# Patient Record
Sex: Female | Born: 1965 | Race: Asian | Hispanic: No | Marital: Married | Smoking: Never smoker
Health system: Southern US, Community
[De-identification: ages and names within clinical notes are randomized; demographics above are authoritative.]

## PROBLEM LIST (undated history)

## (undated) DIAGNOSIS — T7840XA Allergy, unspecified, initial encounter: Secondary | ICD-10-CM

## (undated) DIAGNOSIS — R42 Dizziness and giddiness: Secondary | ICD-10-CM

## (undated) HISTORY — DX: Allergy, unspecified, initial encounter: T78.40XA

## (undated) HISTORY — DX: Dizziness and giddiness: R42

## (undated) HISTORY — PX: WISDOM TOOTH EXTRACTION: SHX21

---

## 2003-06-15 ENCOUNTER — Encounter: Admission: RE | Admit: 2003-06-15 | Discharge: 2003-06-29 | Payer: Self-pay | Admitting: Neurology

## 2006-02-27 ENCOUNTER — Encounter: Admission: RE | Admit: 2006-02-27 | Discharge: 2006-02-27 | Payer: Self-pay | Admitting: Family Medicine

## 2006-08-29 ENCOUNTER — Encounter: Admission: RE | Admit: 2006-08-29 | Discharge: 2006-08-29 | Payer: Self-pay | Admitting: Internal Medicine

## 2007-01-31 ENCOUNTER — Other Ambulatory Visit: Admission: RE | Admit: 2007-01-31 | Discharge: 2007-01-31 | Payer: Self-pay | Admitting: Gynecology

## 2007-03-08 ENCOUNTER — Encounter: Admission: RE | Admit: 2007-03-08 | Discharge: 2007-03-08 | Payer: Self-pay | Admitting: Internal Medicine

## 2007-03-19 ENCOUNTER — Ambulatory Visit (HOSPITAL_COMMUNITY): Admission: RE | Admit: 2007-03-19 | Discharge: 2007-03-19 | Payer: Self-pay | Admitting: Gynecology

## 2008-11-10 ENCOUNTER — Encounter: Admission: RE | Admit: 2008-11-10 | Discharge: 2008-11-10 | Payer: Self-pay | Admitting: Internal Medicine

## 2010-07-17 ENCOUNTER — Encounter: Payer: Self-pay | Admitting: Family Medicine

## 2010-08-05 ENCOUNTER — Other Ambulatory Visit: Payer: Self-pay | Admitting: Internal Medicine

## 2010-08-05 DIAGNOSIS — Z1231 Encounter for screening mammogram for malignant neoplasm of breast: Secondary | ICD-10-CM

## 2010-08-15 ENCOUNTER — Ambulatory Visit
Admission: RE | Admit: 2010-08-15 | Discharge: 2010-08-15 | Disposition: A | Discharge status: Home / Self Care | Payer: Commercial Indemnity | Source: Ambulatory Visit | Attending: Internal Medicine | Admitting: Internal Medicine

## 2010-08-15 DIAGNOSIS — Z1231 Encounter for screening mammogram for malignant neoplasm of breast: Secondary | ICD-10-CM

## 2013-05-12 ENCOUNTER — Encounter: Payer: Self-pay | Admitting: Obstetrics & Gynecology

## 2013-07-02 ENCOUNTER — Ambulatory Visit: Payer: Self-pay | Admitting: Obstetrics & Gynecology

## 2013-07-09 ENCOUNTER — Ambulatory Visit: Payer: Commercial Indemnity | Admitting: Obstetrics & Gynecology

## 2013-07-16 ENCOUNTER — Encounter: Payer: Self-pay | Admitting: Obstetrics & Gynecology

## 2013-07-16 ENCOUNTER — Ambulatory Visit (INDEPENDENT_AMBULATORY_CARE_PROVIDER_SITE_OTHER): Admitting: Obstetrics & Gynecology

## 2013-07-16 VITALS — BP 101/67 | HR 77 | Temp 97.9°F | Ht 60.0 in | Wt 111.0 lb

## 2013-07-16 DIAGNOSIS — IMO0001 Reserved for inherently not codable concepts without codable children: Secondary | ICD-10-CM

## 2013-07-16 DIAGNOSIS — Z01419 Encounter for gynecological examination (general) (routine) without abnormal findings: Secondary | ICD-10-CM

## 2013-07-16 DIAGNOSIS — N939 Abnormal uterine and vaginal bleeding, unspecified: Secondary | ICD-10-CM

## 2013-07-16 DIAGNOSIS — N926 Irregular menstruation, unspecified: Secondary | ICD-10-CM

## 2013-07-16 LAB — POCT URINALYSIS DIPSTICK
BILIRUBIN UA: NEGATIVE
Blood, UA: NEGATIVE
GLUCOSE UA: NEGATIVE
Ketones, UA: NEGATIVE
LEUKOCYTES UA: NEGATIVE
NITRITE UA: NEGATIVE
Protein, UA: NEGATIVE
Spec Grav, UA: 1.015
UROBILINOGEN UA: NEGATIVE
pH, UA: 7

## 2013-07-16 LAB — POCT URINE PREGNANCY: Preg Test, Ur: NEGATIVE

## 2013-07-16 NOTE — Progress Notes (Signed)
Subjective:     Miranda Holloway is a 48 y.o. female here for a routine exam.  Current complaints: irregular cycles.  Personal health questionnaire reviewed: yes.   Gynecologic History Patient's last menstrual period was 07/07/2013. Contraception: none Last Pap: unsure denies hx of abnormal result Last mammogram:2014 . Results were: normal  The following portions of the patient's history were reviewed and updated as appropriate: allergies, current medications, past family history, past medical history, past social history, past surgical history and problem list.  Review of Systems Pertinent items are noted in HPI.   Objective:     General appearance: alert Breasts: normal appearance, no masses or tenderness Abdomen: soft, non-tender; bowel sounds normal; no masses,  no organomegaly Pelvic: cervix normal in appearance, external genitalia normal, no adnexal masses or tenderness, uterus normal size, shape, and consistency and vagina normal without discharge     Assessment:  Irregular periods- bleeding in between cycles x 2 episodes, no pain, no history of fibroids, denies thyroid problems, denies breast drainage nabothian cyst on cervix   Plan:   Pelvic ultrasound  TSH Prolactin Return after the U/S

## 2013-07-17 LAB — TSH: TSH: 1.616 u[IU]/mL (ref 0.350–4.500)

## 2013-07-17 LAB — PROLACTIN: Prolactin: 9.4 ng/mL

## 2013-07-17 LAB — HIV ANTIBODY (ROUTINE TESTING W REFLEX): HIV: NONREACTIVE

## 2013-07-17 LAB — RPR

## 2013-07-18 DIAGNOSIS — N926 Irregular menstruation, unspecified: Secondary | ICD-10-CM | POA: Insufficient documentation

## 2013-07-18 LAB — PAP IG, CT-NG, RFX HPV ASCU
CHLAMYDIA PROBE AMP: NEGATIVE
GC PROBE AMP: NEGATIVE

## 2013-07-18 NOTE — Patient Instructions (Signed)
Health Maintenance, Female A healthy lifestyle and preventative care can promote health and wellness.  Maintain regular health, dental, and eye exams.  Eat a healthy diet. Foods like vegetables, fruits, whole grains, low-fat dairy products, and lean protein foods contain the nutrients you need without too many calories. Decrease your intake of foods high in solid fats, added sugars, and salt. Get information about a proper diet from your caregiver, if necessary.  Regular physical exercise is one of the most important things you can do for your health. Most adults should get at least 150 minutes of moderate-intensity exercise (any activity that increases your heart rate and causes you to sweat) each week. In addition, most adults need muscle-strengthening exercises on 2 or more days a week.   Maintain a healthy weight. The body mass index (BMI) is a screening tool to identify possible weight problems. It provides an estimate of body fat based on height and weight. Your caregiver can help determine your BMI, and can help you achieve or maintain a healthy weight. For adults 20 years and older:  A BMI below 18.5 is considered underweight.  A BMI of 18.5 to 24.9 is normal.  A BMI of 25 to 29.9 is considered overweight.  A BMI of 30 and above is considered obese.  Maintain normal blood lipids and cholesterol by exercising and minimizing your intake of saturated fat. Eat a balanced diet with plenty of fruits and vegetables. Blood tests for lipids and cholesterol should begin at age 20 and be repeated every 5 years. If your lipid or cholesterol levels are high, you are over 50, or you are a high risk for heart disease, you may need your cholesterol levels checked more frequently.Ongoing high lipid and cholesterol levels should be treated with medicines if diet and exercise are not effective.  If you smoke, find out from your caregiver how to quit. If you do not use tobacco, do not start.  Lung  cancer screening is recommended for adults aged 55 80 years who are at high risk for developing lung cancer because of a history of smoking. Yearly low-dose computed tomography (CT) is recommended for people who have at least a 30-pack-year history of smoking and are a current smoker or have quit within the past 15 years. A pack year of smoking is smoking an average of 1 pack of cigarettes a day for 1 year (for example: 1 pack a day for 30 years or 2 packs a day for 15 years). Yearly screening should continue until the smoker has stopped smoking for at least 15 years. Yearly screening should also be stopped for people who develop a health problem that would prevent them from having lung cancer treatment.  If you are pregnant, do not drink alcohol. If you are breastfeeding, be very cautious about drinking alcohol. If you are not pregnant and choose to drink alcohol, do not exceed 1 drink per day. One drink is considered to be 12 ounces (355 mL) of beer, 5 ounces (148 mL) of wine, or 1.5 ounces (44 mL) of liquor.  Avoid use of street drugs. Do not share needles with anyone. Ask for help if you need support or instructions about stopping the use of drugs.  High blood pressure causes heart disease and increases the risk of stroke. Blood pressure should be checked at least every 1 to 2 years. Ongoing high blood pressure should be treated with medicines, if weight loss and exercise are not effective.  If you are 55 to   46 years old, ask your caregiver if you should take aspirin to prevent strokes.  Diabetes screening involves taking a blood sample to check your fasting blood sugar level. This should be done once every 3 years, after age 45, if you are within normal weight and without risk factors for diabetes. Testing should be considered at a younger age or be carried out more frequently if you are overweight and have at least 1 risk factor for diabetes.  Breast cancer screening is essential preventative care  for women. You should practice "breast self-awareness." This means understanding the normal appearance and feel of your breasts and may include breast self-examination. Any changes detected, no matter how small, should be reported to a caregiver. Women in their 20s and 30s should have a clinical breast exam (CBE) by a caregiver as part of a regular health exam every 1 to 3 years. After age 40, women should have a CBE every year. Starting at age 40, women should consider having a mammogram (breast X-ray) every year. Women who have a family history of breast cancer should talk to their caregiver about genetic screening. Women at a high risk of breast cancer should talk to their caregiver about having an MRI and a mammogram every year.  Breast cancer gene (BRCA)-related cancer risk assessment is recommended for women who have family members with BRCA-related cancers. BRCA-related cancers include breast, ovarian, tubal, and peritoneal cancers. Having family members with these cancers may be associated with an increased risk for harmful changes (mutations) in the breast cancer genes BRCA1 and BRCA2. Results of the assessment will determine the need for genetic counseling and BRCA1 and BRCA2 testing.  The Pap test is a screening test for cervical cancer. Women should have a Pap test starting at age 21. Between ages 21 and 29, Pap tests should be repeated every 2 years. Beginning at age 30, you should have a Pap test every 3 years as long as the past 3 Pap tests have been normal. If you had a hysterectomy for a problem that was not cancer or a condition that could lead to cancer, then you no longer need Pap tests. If you are between ages 65 and 70, and you have had normal Pap tests going back 10 years, you no longer need Pap tests. If you have had past treatment for cervical cancer or a condition that could lead to cancer, you need Pap tests and screening for cancer for at least 20 years after your treatment. If Pap  tests have been discontinued, risk factors (such as a new sexual partner) need to be reassessed to determine if screening should be resumed. Some women have medical problems that increase the chance of getting cervical cancer. In these cases, your caregiver may recommend more frequent screening and Pap tests.  The human papillomavirus (HPV) test is an additional test that may be used for cervical cancer screening. The HPV test looks for the virus that can cause the cell changes on the cervix. The cells collected during the Pap test can be tested for HPV. The HPV test could be used to screen women aged 30 years and older, and should be used in women of any age who have unclear Pap test results. After the age of 30, women should have HPV testing at the same frequency as a Pap test.  Colorectal cancer can be detected and often prevented. Most routine colorectal cancer screening begins at the age of 50 and continues through age 75. However, your caregiver   may recommend screening at an earlier age if you have risk factors for colon cancer. On a yearly basis, your caregiver may provide home test kits to check for hidden blood in the stool. Use of a small camera at the end of a tube, to directly examine the colon (sigmoidoscopy or colonoscopy), can detect the earliest forms of colorectal cancer. Talk to your caregiver about this at age 86, when routine screening begins. Direct examination of the colon should be repeated every 5 to 10 years through age 61, unless early forms of pre-cancerous polyps or small growths are found.  Hepatitis C blood testing is recommended for all people born from [birth year] through 1963 and any individual with known risks for hepatitis C.  Practice safe sex. Use condoms and avoid high-risk sexual practices to reduce the spread of sexually transmitted infections (STIs). Sexually active women aged 64 and younger should be checked for Chlamydia, which is a common sexually transmitted infection.  Older women with new or multiple partners should also be tested for Chlamydia. Testing for other STIs is recommended if you are sexually active and at increased risk.  Osteoporosis is a disease in which the bones lose minerals and strength with aging. This can result in serious bone fractures. The risk of osteoporosis can be identified using a bone density scan. Women ages 61 and over and women at risk for fractures or osteoporosis should discuss screening with their caregivers. Ask your caregiver whether you should be taking a calcium supplement or vitamin D to reduce the rate of osteoporosis.  Menopause can be associated with physical symptoms and risks. Hormone replacement therapy is available to decrease symptoms and risks. You should talk to your caregiver about whether hormone replacement therapy is right for you.  Use sunscreen. Apply sunscreen liberally and repeatedly throughout the day. You should seek shade when your shadow is shorter than you. Protect yourself by wearing long sleeves, pants, a wide-brimmed hat, and sunglasses year round, whenever you are outdoors.  Notify your caregiver of new moles or changes in moles, especially if there is a change in shape or color. Also notify your caregiver if a mole is larger than the size of a pencil eraser.  Stay current with your immunizations. Document Released: 12/26/2010 Document Revised: 10/07/2012 Document Reviewed: 12/26/2010 [facility] Patient Information 2014 [place]. Abnormal Uterine Bleeding Abnormal uterine bleeding can affect women at various stages in life, including teenagers, women in their reproductive years, pregnant women, and women who have reached menopause. Several kinds of uterine bleeding are considered abnormal, including:  Bleeding or spotting between periods.   Bleeding after sexual intercourse.   Bleeding that is heavier or more than normal.   Periods that last longer than usual.  Bleeding after  menopause.  Many cases of abnormal uterine bleeding are minor and simple to treat, while others are more serious. Any type of abnormal bleeding should be evaluated by your health care provider. Treatment will depend on the cause of the bleeding. HOME CARE INSTRUCTIONS Monitor your condition for any changes. The following actions may help to alleviate any discomfort you are experiencing:  Avoid the use of tampons and douches as directed by your health care provider.  Change your pads frequently. You should get regular pelvic exams and Pap tests. Keep all follow-up appointments for diagnostic tests as directed by your health care provider.  SEEK MEDICAL CARE IF:   Your bleeding lasts more than 1 week.   You feel dizzy at times.  SEEK IMMEDIATE  MEDICAL CARE IF:   You pass out.   You are changing pads every 15 to 30 minutes.   You have abdominal pain.  You have a fever.   You become sweaty or weak.   You are passing large blood clots from the vagina.   You start to feel nauseous and vomit. MAKE SURE YOU:   Understand these instructions.  Will watch your condition.  Will get help right away if you are not doing well or get worse. Document Released: 06/12/2005 Document Revised: 02/12/2013 Document Reviewed: 01/09/2013 [facility] Patient Information 2014 [place], [place].

## 2013-07-22 ENCOUNTER — Other Ambulatory Visit: Payer: Self-pay | Admitting: *Deleted

## 2013-07-22 DIAGNOSIS — N926 Irregular menstruation, unspecified: Secondary | ICD-10-CM

## 2013-07-23 ENCOUNTER — Ambulatory Visit (INDEPENDENT_AMBULATORY_CARE_PROVIDER_SITE_OTHER)

## 2013-07-23 ENCOUNTER — Encounter: Payer: Self-pay | Admitting: Obstetrics & Gynecology

## 2013-07-23 ENCOUNTER — Ambulatory Visit (INDEPENDENT_AMBULATORY_CARE_PROVIDER_SITE_OTHER): Admitting: Obstetrics & Gynecology

## 2013-07-23 VITALS — BP 92/63 | HR 87 | Temp 98.0°F | Ht 61.0 in | Wt 111.0 lb

## 2013-07-23 DIAGNOSIS — N926 Irregular menstruation, unspecified: Secondary | ICD-10-CM

## 2013-07-23 DIAGNOSIS — D259 Leiomyoma of uterus, unspecified: Secondary | ICD-10-CM

## 2013-07-23 NOTE — Progress Notes (Signed)
Subjective:     Miranda Holloway is a 48 y.o. female here for a routine exam.  Current complaints: Patient is in office today for follow up of ultrasound results.  Personal health questionnaire reviewed: yes.   Gynecologic History Patient's last menstrual period was 07/07/2013. Contraception: none Last Pap: 06/2013. Results were: normal Last mammogram: 2014.   Obstetric History OB History  Gravida Para Term Preterm AB SAB TAB Ectopic Multiple Living  0 0                 The following portions of the patient's history were reviewed and updated as appropriate: allergies, current medications, past family history, past medical history, past social history, past surgical history and problem list.  Review of Systems Pertinent items are noted in HPI.    Objective:     No exam today     Assessment:    AUB--O, L   Plan:   F/U prn or in 6 mths

## 2013-07-24 ENCOUNTER — Encounter: Payer: Self-pay | Admitting: Obstetrics & Gynecology

## 2013-07-24 DIAGNOSIS — D259 Leiomyoma of uterus, unspecified: Secondary | ICD-10-CM | POA: Insufficient documentation

## 2013-07-24 NOTE — Patient Instructions (Signed)
Fibroids Fibroids are lumps (tumors) that can occur any place in a woman's body. These lumps are not cancerous. Fibroids vary in size, weight, and where they grow. HOME CARE  Do not take aspirin.  Write down the number of pads or tampons you use during your period. Tell your doctor. This can help determine the best treatment for you. GET HELP RIGHT AWAY IF:  You have pain in your lower belly (abdomen) that is not helped with medicine.  You have cramps that are not helped with medicine.  You have more bleeding between or during your period.  You feel lightheaded or pass out (faint).  Your lower belly pain gets worse. MAKE SURE YOU:  Understand these instructions.  Will watch your condition.  Will get help right away if you are not doing well or get worse. Document Released: 07/15/2010 Document Revised: 09/04/2011 Document Reviewed: 07/15/2010 [employer] Patient Information 2014 [employer].  

## 2014-01-21 ENCOUNTER — Ambulatory Visit: Admitting: Obstetrics & Gynecology

## 2014-02-12 ENCOUNTER — Encounter: Payer: Self-pay | Admitting: Obstetrics & Gynecology

## 2014-02-12 ENCOUNTER — Ambulatory Visit (INDEPENDENT_AMBULATORY_CARE_PROVIDER_SITE_OTHER): Admitting: Obstetrics & Gynecology

## 2014-02-12 VITALS — BP 103/70 | HR 71 | Temp 98.3°F | Ht 62.0 in | Wt 118.0 lb

## 2014-02-12 DIAGNOSIS — D259 Leiomyoma of uterus, unspecified: Secondary | ICD-10-CM

## 2014-02-12 NOTE — Patient Instructions (Signed)
Fibroids Fibroids are lumps (tumors) that can occur any place in a woman's body. These lumps are not cancerous. Fibroids vary in size, weight, and where they grow. HOME CARE  Do not take aspirin.  Write down the number of pads or tampons you use during your period. Tell your doctor. This can help determine the best treatment for you. GET HELP RIGHT AWAY IF:  You have pain in your lower belly (abdomen) that is not helped with medicine.  You have cramps that are not helped with medicine.  You have more bleeding between or during your period.  You feel lightheaded or pass out (faint).  Your lower belly pain gets worse. MAKE SURE YOU:  Understand these instructions.  Will watch your condition.  Will get help right away if you are not doing well or get worse. Document Released: 07/15/2010 Document Revised: 09/04/2011 Document Reviewed: 07/15/2010 [employer] Patient Information 2015 [employer]. This information is not intended to replace advice given to you by your health care provider. Make sure you discuss any questions you have with your health care provider.  

## 2014-02-13 NOTE — Progress Notes (Signed)
Subjective:     Miranda Holloway is a 48 y.o. female here for a f/u exam. Denies abnormal uterine bleeding.   Gynecologic History Patient's last menstrual period was 01/18/2014. Contraception: none Last Pap: 06/2013. Results were: normal Last mammogram: 2014.   Obstetric History OB History  Gravida Para Term Preterm AB SAB TAB Ectopic Multiple Living  0 0              BP 103/70  Pulse 71  Temp(Src) 98.3 F (36.8 C)  Ht 5\' 2"  (1.575 m)  Wt 53.524 kg (118 lb)  BMI 21.58 kg/m2  LMP 01/18/2014  General:   alert  Abdomen:  normal findings: no organomegaly, soft, non-tender and no hernia  Pelvis:  External genitalia: normal general appearance Urinary system: urethral meatus normal and bladder without fullness, nontender Vaginal: normal without tenderness, induration or masses Cervix: normal appearance Adnexa: normal bimanual exam Uterus: anteverted and non-tender, normal size    The following portions of the patient's history were reviewed and updated as appropriate: allergies, current medications, past family history, past medical history, past social history, past surgical history and problem list.  Review of Systems Pertinent items are noted in HPI.      Assessment:    Small myomatous uterus; AUB resolved   Plan:   F/U prn or in 1 yr

## 2014-06-22 ENCOUNTER — Encounter: Payer: Self-pay | Admitting: *Deleted

## 2014-06-23 ENCOUNTER — Encounter: Payer: Self-pay | Admitting: Obstetrics & Gynecology

## 2015-03-03 ENCOUNTER — Ambulatory Visit: Admitting: Obstetrics & Gynecology

## 2015-05-28 ENCOUNTER — Encounter: Payer: Commercial Indemnity | Admitting: Internal Medicine

## 2016-04-05 ENCOUNTER — Other Ambulatory Visit: Payer: Self-pay | Admitting: *Deleted

## 2016-04-05 ENCOUNTER — Inpatient Hospital Stay
Admission: RE | Admit: 2016-04-05 | Discharge: 2016-04-05 | Disposition: A | Discharge status: Home / Self Care | Payer: Self-pay | Source: Ambulatory Visit | Attending: *Deleted | Admitting: *Deleted

## 2016-04-05 DIAGNOSIS — Z9289 Personal history of other medical treatment: Secondary | ICD-10-CM

## 2016-04-06 ENCOUNTER — Other Ambulatory Visit: Payer: Self-pay | Admitting: Internal Medicine

## 2016-04-06 DIAGNOSIS — R9230 Dense breasts, unspecified: Secondary | ICD-10-CM

## 2016-04-06 DIAGNOSIS — R922 Inconclusive mammogram: Secondary | ICD-10-CM

## 2016-04-19 ENCOUNTER — Ambulatory Visit
Admission: RE | Admit: 2016-04-19 | Discharge: 2016-04-19 | Disposition: A | Discharge status: Home / Self Care | Source: Ambulatory Visit | Attending: Internal Medicine | Admitting: Internal Medicine

## 2016-04-19 DIAGNOSIS — N6489 Other specified disorders of breast: Secondary | ICD-10-CM | POA: Insufficient documentation

## 2016-04-19 DIAGNOSIS — R922 Inconclusive mammogram: Secondary | ICD-10-CM

## 2017-11-20 IMAGING — MG MM DIGITAL DIAGNOSTIC UNILAT*R* W/ TOMO W/ CAD
6 of 9 series · 6 of 21 positions shown · non-contrast
Comparison: Previous exam(s).

CLINICAL DATA: Screening recall for 2 right breast asymmetries.

EXAM:
2D DIGITAL DIAGNOSTIC UNILATERAL RIGHT MAMMOGRAM WITH CAD AND
ADJUNCT TOMO
RIGHT BREAST ULTRASOUND

[R MLO]
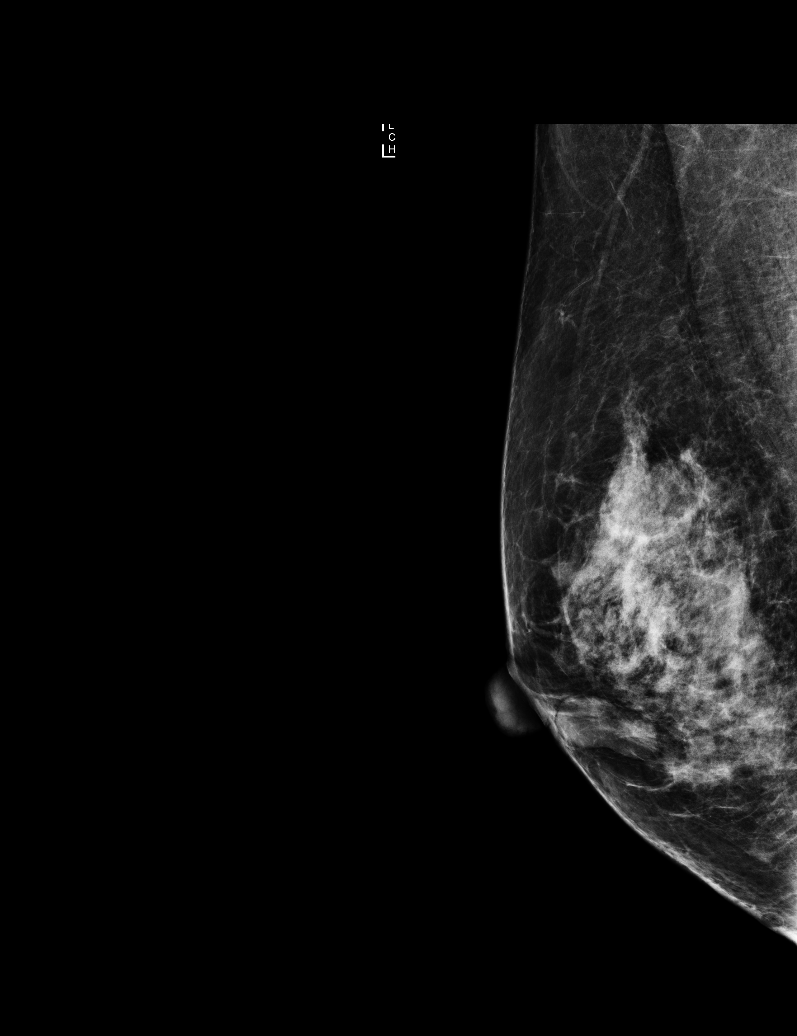

[R CC]
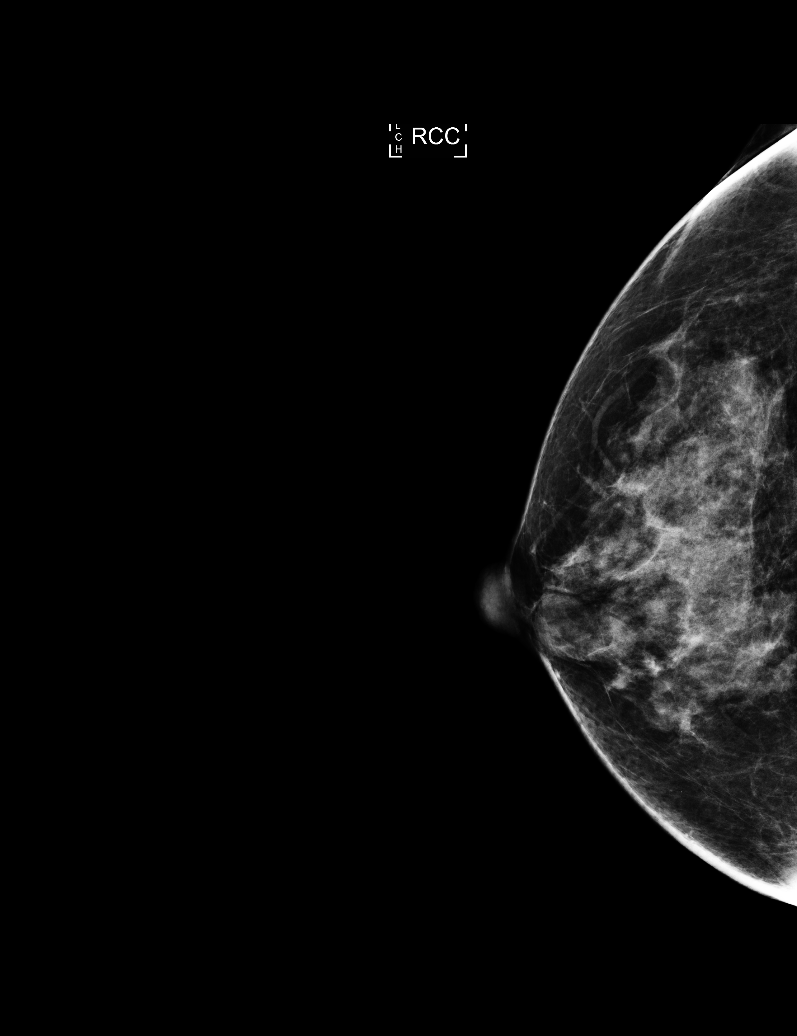

[R CC synth-2D]
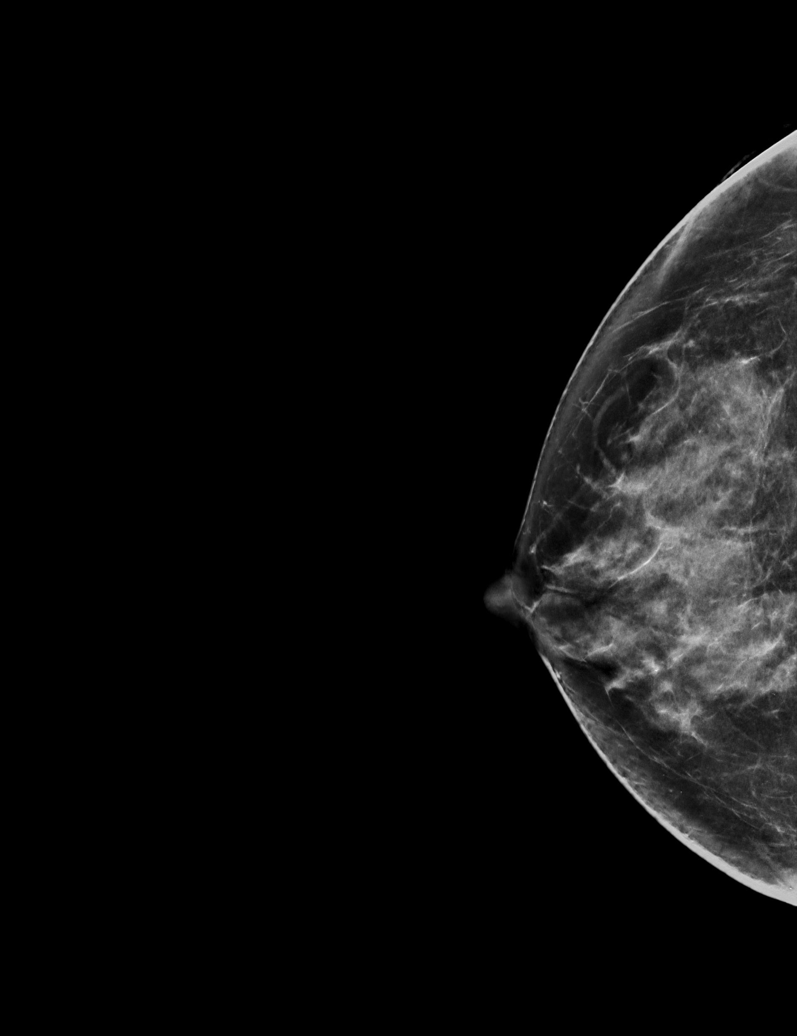

[R MLO synth-2D]
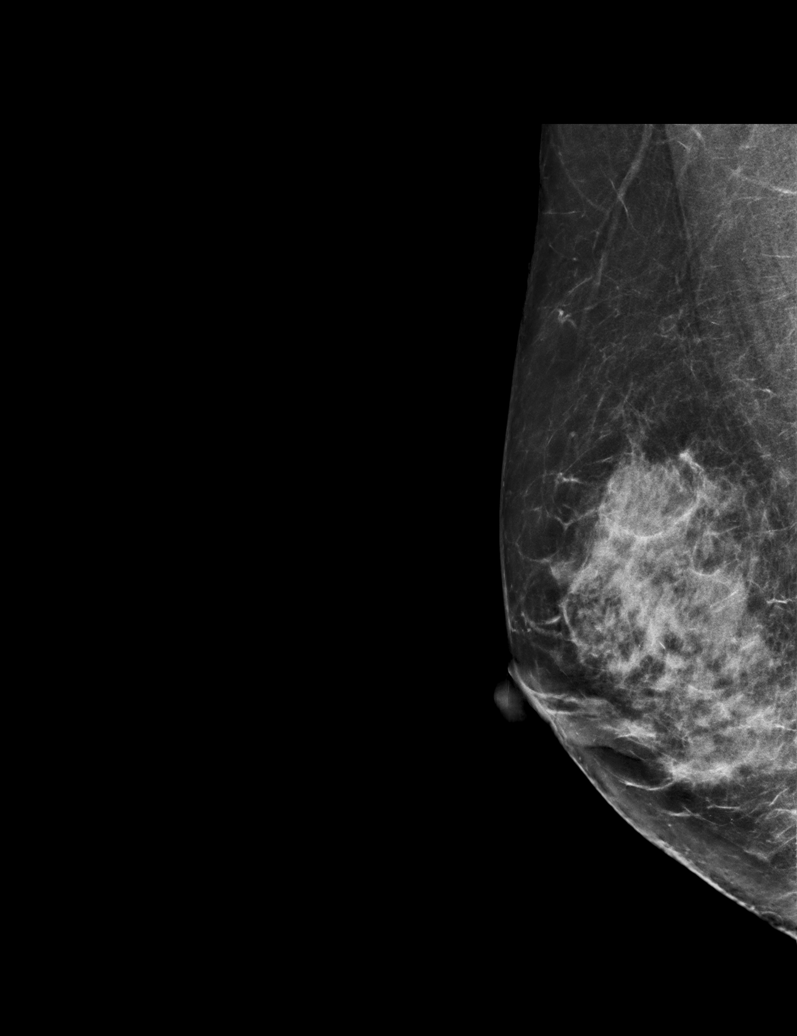

[R XCCL]
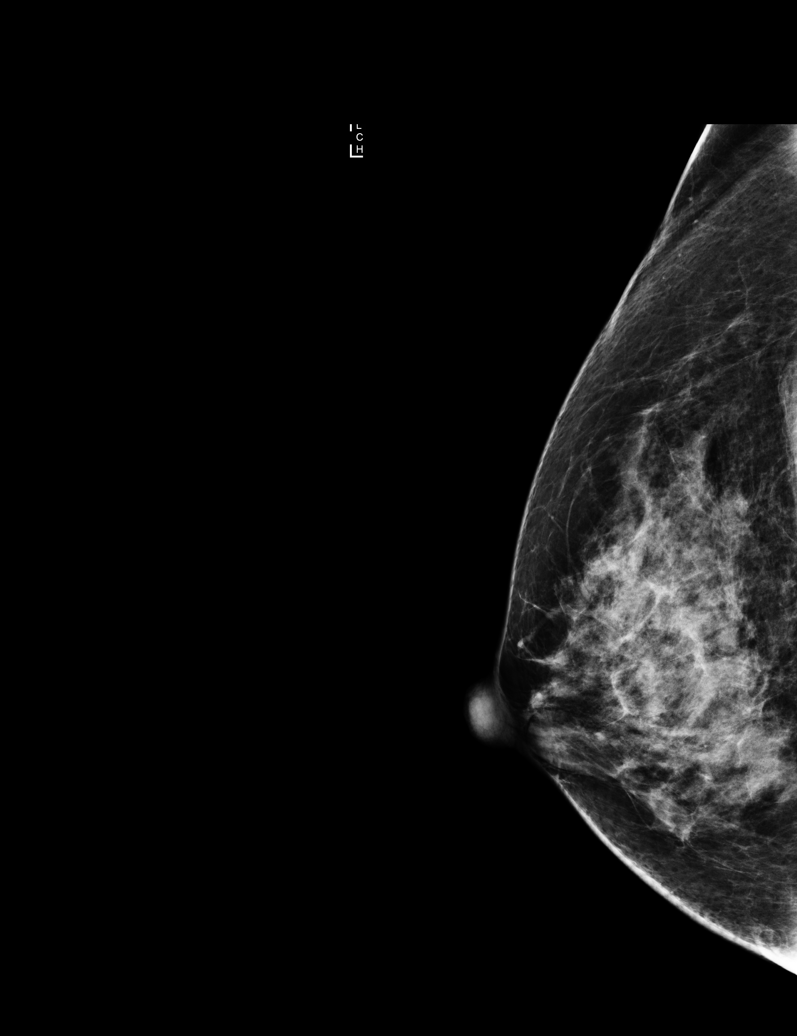

[R XCCL synth-2D]
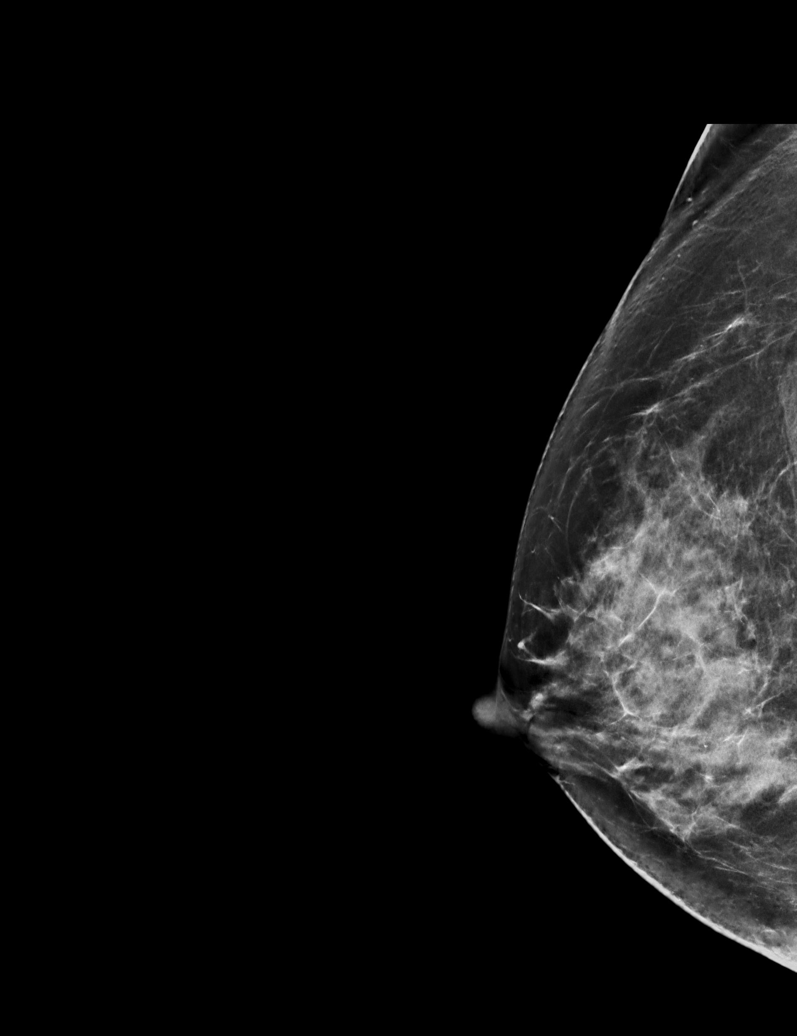

[6 of 21 positions shown; findings below may reference images not displayed]

ACR Breast Density Category c: The breast tissue is heterogeneously
dense, which may obscure small masses.
FINDINGS: In the lateral aspect of the right breast, far posterior depth there
is an obscured 1.3 cm mass. The second asymmetry in the upper-outer
quadrant the right breast resolves with additional tomosynthesis
diagnostic images.

Mammographic images were processed with CAD.

No palpable masses are identified in the upper-outer right breast on
physical exam.

Ultrasound targeted to the lateral and upper outer right breast
demonstrates numerous anechoic circumscribed masses consistent with
benign cysts. Representative images, which are thought to account
for the mammographic abnormality were acquired at the 840 5 o'clock
position, 5 cm from the nipple. The largest cyst at this location
measures 1.6 x 0.4 x 1.6 cm.
IMPRESSION: 1. The asymmetry of concern in the lateral right breast corresponds
with a benign cyst.

2. The asymmetry in the upper-outer right breast resolves with
additional tomosynthesis imaging consistent with overlapping
fibroglandular tissue.

RECOMMENDATION:
Screening mammogram in one year.(Code:EB-L-065)

I have discussed the findings and recommendations with the patient.
Results were also provided in writing at the conclusion of the
visit. If applicable, a reminder letter will be sent to the patient
regarding the next appointment.

BI-RADS CATEGORY  2: Benign.

## 2018-07-09 ENCOUNTER — Other Ambulatory Visit: Payer: Self-pay | Admitting: Obstetrics and Gynecology

## 2018-07-09 DIAGNOSIS — Z1231 Encounter for screening mammogram for malignant neoplasm of breast: Secondary | ICD-10-CM

## 2018-07-12 ENCOUNTER — Ambulatory Visit
Admission: RE | Admit: 2018-07-12 | Discharge: 2018-07-12 | Disposition: A | Discharge status: Home / Self Care | Source: Ambulatory Visit | Attending: Obstetrics and Gynecology | Admitting: Obstetrics and Gynecology

## 2018-07-12 DIAGNOSIS — Z1231 Encounter for screening mammogram for malignant neoplasm of breast: Secondary | ICD-10-CM | POA: Diagnosis present

## 2019-07-14 ENCOUNTER — Ambulatory Visit: Payer: Commercial Indemnity | Attending: Internal Medicine

## 2019-07-14 DIAGNOSIS — Z20822 Contact with and (suspected) exposure to covid-19: Secondary | ICD-10-CM

## 2019-07-15 ENCOUNTER — Other Ambulatory Visit: Payer: Self-pay | Admitting: Obstetrics and Gynecology

## 2019-07-15 ENCOUNTER — Ambulatory Visit
Admission: RE | Admit: 2019-07-15 | Discharge: 2019-07-15 | Disposition: A | Discharge status: Home / Self Care | Source: Ambulatory Visit | Attending: Obstetrics and Gynecology | Admitting: Obstetrics and Gynecology

## 2019-07-15 DIAGNOSIS — Z1231 Encounter for screening mammogram for malignant neoplasm of breast: Secondary | ICD-10-CM | POA: Diagnosis not present

## 2019-07-15 LAB — NOVEL CORONAVIRUS, NAA: SARS-CoV-2, NAA: NOT DETECTED

## 2020-08-02 ENCOUNTER — Other Ambulatory Visit: Payer: Self-pay | Admitting: Internal Medicine

## 2020-08-02 DIAGNOSIS — Z1231 Encounter for screening mammogram for malignant neoplasm of breast: Secondary | ICD-10-CM

## 2020-08-04 ENCOUNTER — Other Ambulatory Visit: Payer: Self-pay

## 2020-08-04 ENCOUNTER — Ambulatory Visit
Admission: RE | Admit: 2020-08-04 | Discharge: 2020-08-04 | Disposition: A | Discharge status: Home / Self Care | Source: Ambulatory Visit | Attending: Internal Medicine | Admitting: Internal Medicine

## 2020-08-04 DIAGNOSIS — Z1231 Encounter for screening mammogram for malignant neoplasm of breast: Secondary | ICD-10-CM | POA: Diagnosis present

## 2021-06-17 ENCOUNTER — Other Ambulatory Visit: Payer: Self-pay | Admitting: Internal Medicine

## 2021-06-17 DIAGNOSIS — Z1231 Encounter for screening mammogram for malignant neoplasm of breast: Secondary | ICD-10-CM

## 2021-08-05 ENCOUNTER — Other Ambulatory Visit: Payer: Self-pay

## 2021-08-05 ENCOUNTER — Ambulatory Visit
Admission: RE | Admit: 2021-08-05 | Discharge: 2021-08-05 | Disposition: A | Discharge status: Home / Self Care | Source: Ambulatory Visit | Attending: Internal Medicine | Admitting: Internal Medicine

## 2021-08-05 DIAGNOSIS — Z1231 Encounter for screening mammogram for malignant neoplasm of breast: Secondary | ICD-10-CM | POA: Diagnosis present

## 2022-05-15 ENCOUNTER — Ambulatory Visit: Admitting: Dermatology

## 2022-06-14 ENCOUNTER — Other Ambulatory Visit: Payer: Self-pay | Admitting: Internal Medicine

## 2022-06-14 DIAGNOSIS — Z1231 Encounter for screening mammogram for malignant neoplasm of breast: Secondary | ICD-10-CM

## 2022-08-07 ENCOUNTER — Encounter

## 2022-09-01 ENCOUNTER — Ambulatory Visit
Admission: RE | Admit: 2022-09-01 | Discharge: 2022-09-01 | Disposition: A | Discharge status: Home / Self Care | Source: Ambulatory Visit | Attending: Internal Medicine | Admitting: Internal Medicine

## 2022-09-01 DIAGNOSIS — Z1231 Encounter for screening mammogram for malignant neoplasm of breast: Secondary | ICD-10-CM | POA: Insufficient documentation

## 2023-09-03 ENCOUNTER — Other Ambulatory Visit: Payer: Self-pay | Admitting: Internal Medicine

## 2023-09-03 DIAGNOSIS — Z1231 Encounter for screening mammogram for malignant neoplasm of breast: Secondary | ICD-10-CM

## 2023-09-11 ENCOUNTER — Ambulatory Visit
Admission: RE | Admit: 2023-09-11 | Discharge: 2023-09-11 | Disposition: A | Discharge status: Home / Self Care | Source: Ambulatory Visit | Attending: Internal Medicine | Admitting: Internal Medicine

## 2023-09-11 DIAGNOSIS — Z1231 Encounter for screening mammogram for malignant neoplasm of breast: Secondary | ICD-10-CM | POA: Diagnosis present

## 2023-09-25 ENCOUNTER — Other Ambulatory Visit: Payer: Self-pay | Admitting: Internal Medicine

## 2023-09-25 DIAGNOSIS — M25511 Pain in right shoulder: Secondary | ICD-10-CM

## 2023-09-25 DIAGNOSIS — M7581 Other shoulder lesions, right shoulder: Secondary | ICD-10-CM

## 2023-10-02 ENCOUNTER — Ambulatory Visit
Admission: RE | Admit: 2023-10-02 | Discharge: 2023-10-02 | Disposition: A | Discharge status: Home / Self Care | Source: Ambulatory Visit | Attending: Internal Medicine | Admitting: Internal Medicine

## 2023-10-02 DIAGNOSIS — M25511 Pain in right shoulder: Secondary | ICD-10-CM | POA: Diagnosis present

## 2023-10-02 DIAGNOSIS — M7581 Other shoulder lesions, right shoulder: Secondary | ICD-10-CM | POA: Insufficient documentation
